# Patient Record
Sex: Female | Born: 1995 | Race: White | Hispanic: No | Marital: Single | Smoking: Never smoker
Health system: Southern US, Community
[De-identification: ages and names within clinical notes are randomized; demographics above are authoritative.]

## PROBLEM LIST (undated history)

## (undated) DIAGNOSIS — F32A Depression, unspecified: Secondary | ICD-10-CM

## (undated) DIAGNOSIS — F329 Major depressive disorder, single episode, unspecified: Secondary | ICD-10-CM

## (undated) DIAGNOSIS — Z789 Other specified health status: Secondary | ICD-10-CM

## (undated) DIAGNOSIS — Z8619 Personal history of other infectious and parasitic diseases: Secondary | ICD-10-CM

## (undated) HISTORY — PX: NO PAST SURGERIES: SHX2092

## (undated) HISTORY — DX: Other specified health status: Z78.9

## (undated) HISTORY — DX: Personal history of other infectious and parasitic diseases: Z86.19

---
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
## 2014-01-22 ENCOUNTER — Encounter (HOSPITAL_COMMUNITY): Payer: Self-pay

## 2015-10-28 ENCOUNTER — Ambulatory Visit (HOSPITAL_COMMUNITY)
Admission: EM | Admit: 2015-10-28 | Discharge: 2015-10-28 | Disposition: A | Discharge status: Home / Self Care | Payer: Medicaid Other | Attending: Emergency Medicine | Admitting: Emergency Medicine

## 2015-10-28 ENCOUNTER — Encounter (HOSPITAL_COMMUNITY): Payer: Self-pay | Admitting: Emergency Medicine

## 2015-10-28 DIAGNOSIS — J020 Streptococcal pharyngitis: Secondary | ICD-10-CM | POA: Diagnosis not present

## 2015-10-28 DIAGNOSIS — J029 Acute pharyngitis, unspecified: Secondary | ICD-10-CM

## 2015-10-28 MED ORDER — AMOXICILLIN 500 MG PO CAPS: 500.0000 mg | ORAL_CAPSULE | Freq: Three times a day (TID) | ORAL | 0 refills | Status: DC

## 2015-10-28 NOTE — ED Triage Notes (Signed)
[facility] a [facility] of a sore throat x 1 week. The patient stated that she was rapid strep tested positive 3 days ago at her daughter's pediatrician but has not been treated. The patient stated that she has started self-medicating with some amoxicillin that she already had at home.

## 2015-10-28 NOTE — ED Provider Notes (Signed)
CSN: [id number]     Arrival date & time 10/28/15  1000 History   None    Chief Complaint  Patient presents with  . Sore Throat   (Consider location/radiation/quality/duration/timing/severity/associated sxs/prior Treatment) 20 year old female complaining of sore throat for crater or equal to 1 week. Is associated with voice changes. Denies fever or chills. She states that her daughter was diagnosed with strep throat a couple days ago and while she was there her daughter's physician swapped her throat and told her she had a positive strep throat.      Past Medical History:  Diagnosis Date  . Hx of varicella   . Medical history non-contributory    Past Surgical History:  Procedure Laterality Date  . NO PAST SURGERIES     Family History  Problem Relation Age of Onset  . Hypertension Mother   . Hyperlipidemia Mother   . Hyperlipidemia Father   . Hypertension Father   . Asthma Maternal Grandmother   . COPD Maternal Grandmother   . Hypertension Maternal Grandmother   . Hyperlipidemia Maternal Grandmother   . Hyperlipidemia Maternal Grandfather   . Hypertension Maternal Grandfather   . Hyperlipidemia Paternal Grandmother   . Hypertension Paternal Grandmother   . Hyperlipidemia Paternal Grandfather   . Hypertension Paternal Grandfather   . Alcohol abuse Neg Hx   . Arthritis Neg Hx   . Cancer Neg Hx   . Birth defects Neg Hx   . Depression Neg Hx   . Diabetes Neg Hx   . Drug abuse Neg Hx   . Early death Neg Hx   . Hearing loss Neg Hx   . Heart disease Neg Hx   . Kidney disease Neg Hx   . Learning disabilities Neg Hx   . Mental illness Neg Hx   . Mental retardation Neg Hx   . Miscarriages / Stillbirths Neg Hx   . Stroke Neg Hx   . Vision loss Neg Hx   . Varicose Veins Neg Hx    Social History  Substance Use Topics  . Smoking status: Never Smoker  . Smokeless tobacco: Never Used  . Alcohol use No   OB History    Gravida Para Term Preterm AB Living   SAB TAB Ectopic Multiple Live Births           1     Review of Systems  Constitutional: Negative.  Negative for chills, fatigue and fever.       Noted in HPI otherwise Negative  HENT: Positive for sore throat and voice change. Negative for congestion, ear discharge, ear pain, sinus pressure and trouble swallowing.        Noted in HPI, otherwise negative  Eyes: Negative for pain.  Respiratory: Positive for cough. Negative for shortness of breath and wheezing.   Cardiovascular: Negative for chest pain, palpitations and leg swelling.  Gastrointestinal: Negative.   Musculoskeletal: Negative.   Skin: Negative for rash.  Neurological: Negative.   Psychiatric/Behavioral: Negative.     Allergies  Review of patient's allergies indicates no known allergies.  Home Medications   Prior to Admission medications   Medication Sig Start Date End Date Taking? Authorizing Provider  amoxicillin (AMOXIL) 500 MG capsule Take 1 capsule (500 mg total) by mouth 3 (three) times daily. 10/28/15   Hayden Rasmussen, NP  oxyCODONE-acetaminophen (PERCOCET/ROXICET) 5-325 MG per tablet Take 1 tablet by mouth every 4 (four) hours as needed (for pain scale less than  7). 12/06/13   Levi AlandMark E Anderson, MD  Prenatal Vit-Min-FA-Fish Oil ([facility] PRENATAL GUMMY PO) Take 1 tablet by mouth daily.    Historical Provider, MD   Meds Ordered and Administered this Visit  Medications - No data to display  BP 116/73 (BP Location: Left Arm)   Pulse 88   Temp 98 F (36.7 C) (Oral)   Resp 12   LMP 09/20/2015 (Exact Date)   SpO2 100%  No data found.   Physical Exam  Constitutional: She is oriented to person, place, and time. She appears well-developed and well-nourished. No distress.  HENT:  Right Ear: External ear normal.  Left Ear: External ear normal.  Mouth/Throat: No oropharyngeal exudate.  Oropharynx with much cobblestoning and clear PND. No exudates. No swelling. No tonsillar enlargement.  Eyes: EOM are normal. Pupils are  equal, round, and reactive to light.  Neck: Normal range of motion. Neck supple.  Cardiovascular: Normal rate, regular rhythm and normal heart sounds.   Pulmonary/Chest: Effort normal and breath sounds normal. No respiratory distress.  Musculoskeletal: Normal range of motion. She exhibits no edema.  Lymphadenopathy:    She has no cervical adenopathy.  Neurological: She is alert and oriented to person, place, and time.  Skin: Skin is warm and dry. No rash noted.  Psychiatric: She has a normal mood and affect.  Nursing note and vitals reviewed.   Urgent Care Course   Clinical Course    Procedures (including critical care time)  Labs Review Labs Reviewed - No data to display  Imaging Review No results found.   Visual Acuity Review  Right Eye Distance:   Left Eye Distance:   Bilateral Distance:    Right Eye Near:   Left Eye Near:    Bilateral Near:         MDM   1. Sore throat   2. Acute streptococcal pharyngitis    Meds ordered this encounter  Medications  . DISCONTD: amoxicillin (AMOXIL) 250 MG capsule    Sig: Take 250 mg by mouth 2 (two) times daily.  Marland Kitchen. amoxicillin (AMOXIL) 500 MG capsule    Sig: Take 1 capsule (500 mg total) by mouth 3 (three) times daily.    Dispense:  21 capsule    Refill:  0    Order Specific Question:   Supervising Provider    Answer:   Charm RingsHONIG, ERIN J [[id number]]   Lots of fluids Motrin prn Allegra or Zyrtec for PND    Hayden Rasmussenavid Dari Carpenito, NP 10/28/15 1057

## 2018-03-23 NOTE — L&D Delivery Note (Signed)
Delivery Note Patient pushed well with two contractions.  At 5:59 PM a viable female was delivered via Vaginal, Spontaneous (Presentation:   Occiput Anterior).  APGAR: 8, 9; weight 3314 g ( 7 lb 4.9 oz).  Placenta status: Spontaneous, Intact.  Cord: 3 vessels with the following complications: None.  Cord pH: n/a  Anesthesia: Epidural Episiotomy: None Lacerations:  2nd degree Suture Repair: 3.0 vicryl rapide Est. Blood Loss (mL):  108  Mom to postpartum.  Baby to Couplet care / Skin to Skin.  [place] 03/04/2019, 6:18 PM

## 2018-03-31 ENCOUNTER — Emergency Department (HOSPITAL_BASED_OUTPATIENT_CLINIC_OR_DEPARTMENT_OTHER): Payer: BLUE CROSS/BLUE SHIELD

## 2018-03-31 ENCOUNTER — Emergency Department (HOSPITAL_BASED_OUTPATIENT_CLINIC_OR_DEPARTMENT_OTHER)
Admission: EM | Admit: 2018-03-31 | Discharge: 2018-03-31 | Disposition: A | Discharge status: Home / Self Care | Payer: BLUE CROSS/BLUE SHIELD | Attending: Emergency Medicine | Admitting: Emergency Medicine

## 2018-03-31 ENCOUNTER — Other Ambulatory Visit: Payer: Self-pay

## 2018-03-31 ENCOUNTER — Encounter (HOSPITAL_BASED_OUTPATIENT_CLINIC_OR_DEPARTMENT_OTHER): Payer: Self-pay

## 2018-03-31 DIAGNOSIS — M546 Pain in thoracic spine: Secondary | ICD-10-CM

## 2018-03-31 DIAGNOSIS — Y939 Activity, unspecified: Secondary | ICD-10-CM | POA: Insufficient documentation

## 2018-03-31 DIAGNOSIS — M545 Low back pain, unspecified: Secondary | ICD-10-CM

## 2018-03-31 DIAGNOSIS — Z79899 Other long term (current) drug therapy: Secondary | ICD-10-CM | POA: Insufficient documentation

## 2018-03-31 DIAGNOSIS — Y999 Unspecified external cause status: Secondary | ICD-10-CM | POA: Diagnosis not present

## 2018-03-31 MED ORDER — METHOCARBAMOL 500 MG PO TABS: 500.0000 mg | ORAL_TABLET | Freq: Two times a day (BID) | ORAL | 0 refills | Status: DC

## 2018-03-31 MED ORDER — NAPROXEN 500 MG PO TABS: 500.0000 mg | ORAL_TABLET | Freq: Two times a day (BID) | ORAL | 0 refills | Status: DC

## 2018-03-31 NOTE — ED Notes (Signed)
Pt requesting to waive the pregnancy test.
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
## 2018-03-31 NOTE — ED Notes (Signed)
Patient transported to X-ray 
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
## 2018-07-26 LAB — OB RESULTS CONSOLE GC/CHLAMYDIA
Chlamydia: NEGATIVE
Gonorrhea: NEGATIVE

## 2018-09-01 LAB — OB RESULTS CONSOLE HIV ANTIBODY (ROUTINE TESTING): HIV: NONREACTIVE

## 2018-09-01 LAB — OB RESULTS CONSOLE HEPATITIS B SURFACE ANTIGEN: Hepatitis B Surface Ag: NEGATIVE

## 2018-09-01 LAB — OB RESULTS CONSOLE RUBELLA ANTIBODY, IGM: Rubella: IMMUNE

## 2018-09-01 LAB — OB RESULTS CONSOLE RPR: RPR: NONREACTIVE

## 2019-01-23 ENCOUNTER — Encounter (HOSPITAL_COMMUNITY): Payer: Self-pay | Admitting: *Deleted

## 2019-01-23 ENCOUNTER — Other Ambulatory Visit: Payer: Self-pay

## 2019-01-23 ENCOUNTER — Inpatient Hospital Stay (HOSPITAL_COMMUNITY)
Admission: AD | Admit: 2019-01-23 | Discharge: 2019-01-27 | DRG: 833 | Disposition: A | Discharge status: Home / Self Care | Payer: Managed Care, Other (non HMO) | Attending: Obstetrics and Gynecology | Admitting: Obstetrics and Gynecology

## 2019-01-23 DIAGNOSIS — O4703 False labor before 37 completed weeks of gestation, third trimester: Secondary | ICD-10-CM | POA: Diagnosis present

## 2019-01-23 DIAGNOSIS — O479 False labor, unspecified: Secondary | ICD-10-CM

## 2019-01-23 DIAGNOSIS — Z20828 Contact with and (suspected) exposure to other viral communicable diseases: Secondary | ICD-10-CM | POA: Diagnosis present

## 2019-01-23 DIAGNOSIS — Z3A33 33 weeks gestation of pregnancy: Secondary | ICD-10-CM | POA: Diagnosis not present

## 2019-01-23 DIAGNOSIS — O4700 False labor before 37 completed weeks of gestation, unspecified trimester: Secondary | ICD-10-CM

## 2019-01-23 LAB — URINALYSIS, ROUTINE W REFLEX MICROSCOPIC
Bilirubin Urine: NEGATIVE
Glucose, UA: NEGATIVE mg/dL
Hgb urine dipstick: NEGATIVE
Ketones, ur: NEGATIVE mg/dL
Nitrite: NEGATIVE
Protein, ur: 30 mg/dL — AB
Specific Gravity, Urine: 1.026 (ref 1.005–1.030)
pH: 5 (ref 5.0–8.0)

## 2019-01-23 LAB — OB RESULTS CONSOLE GBS: GBS: NEGATIVE

## 2019-01-23 MED ORDER — NIFEDIPINE 10 MG PO CAPS
10.0000 mg | ORAL_CAPSULE | ORAL | Status: AC | PRN
  Administered 2019-01-23 – 2019-01-25 (×4): 10 mg via ORAL
  Filled 2019-01-23 (×4): qty 1

## 2019-01-23 MED ORDER — MAGNESIUM SULFATE 40 GM/1000ML IV SOLN
2.0000 g/h | INTRAVENOUS | Status: DC
  Administered 2019-01-24 (×2): 2 g/h via INTRAVENOUS
  Filled 2019-01-23: qty 1000

## 2019-01-23 MED ORDER — MAGNESIUM SULFATE BOLUS VIA INFUSION
4.0000 g | Freq: Once | INTRAVENOUS | Status: AC
  Administered 2019-01-24: 4 g via INTRAVENOUS
  Filled 2019-01-23: qty 1000

## 2019-01-23 MED ORDER — ACETAMINOPHEN 325 MG PO TABS
650.0000 mg | ORAL_TABLET | ORAL | Status: DC | PRN
  Administered 2019-01-24 – 2019-01-26 (×4): 650 mg via ORAL
  Filled 2019-01-23 (×4): qty 2

## 2019-01-23 MED ORDER — DOCUSATE SODIUM 100 MG PO CAPS
100.0000 mg | ORAL_CAPSULE | Freq: Every day | ORAL | Status: DC
  Administered 2019-01-25 – 2019-01-26 (×2): 100 mg via ORAL
  Filled 2019-01-23 (×3): qty 1

## 2019-01-23 MED ORDER — CALCIUM CARBONATE ANTACID 500 MG PO CHEW: 2.0000 | CHEWABLE_TABLET | ORAL | Status: DC | PRN

## 2019-01-23 MED ORDER — LACTATED RINGERS IV SOLN
INTRAVENOUS | Status: DC
  Administered 2019-01-23 – 2019-01-24 (×3): via INTRAVENOUS

## 2019-01-23 MED ORDER — BETAMETHASONE SOD PHOS & ACET 6 (3-3) MG/ML IJ SUSP
12.0000 mg | Freq: Once | INTRAMUSCULAR | Status: AC
  Administered 2019-01-23: 12 mg via INTRAMUSCULAR
  Filled 2019-01-23: qty 5

## 2019-01-23 MED ORDER — MAGNESIUM SULFATE 40 GM/1000ML IV SOLN
INTRAVENOUS | Status: AC
  Administered 2019-01-24: 4 g via INTRAVENOUS
  Filled 2019-01-23: qty 1000

## 2019-01-23 MED ORDER — BETAMETHASONE SOD PHOS & ACET 6 (3-3) MG/ML IJ SUSP
12.0000 mg | Freq: Once | INTRAMUSCULAR | Status: AC
  Administered 2019-01-24: 12 mg via INTRAMUSCULAR
  Filled 2019-01-23: qty 5

## 2019-01-23 NOTE — MAU Note (Signed)
Been having contractions every 15-20 min for the last 2 days, has been having a lot of pressure. No bleeding or leaking.  Went to dr on [address] [employer], was 1+cm. No problems with preg.

## 2019-01-23 NOTE — MAU Provider Note (Addendum)
History     CSN: [id number]  Arrival date and time: 01/23/19 8466   First Provider Initiated Contact with Patient 01/23/19 2024      Chief Complaint  Patient presents with  . Contractions   Kim Weiss is a 23 y.o. G2P1 at [redacted]w[redacted]d who presents to MAU with complaints of contractions. She reports contractions started occurring yesterday around 0530/0600 when she woke up with contractions that radiated around to her back. She reports contractions are occurring every 15-20 - reports having to breath through the contractions and husband states she "was bent over bed earlier in pain and could not move". She reports increased pelvic pressure. Last prenatal appointment was on Thursday and she was 1.5cm at that appointment. She denies complications during this pregnancy and denies hx of PTL/PTD with last pregnancy. She denies vaginal bleeding, discharge or LOF. She denies recent IC. Reports +FM.   OB History    Gravida  2   Para  1   Term  1   Preterm      AB      Living  1     SAB      TAB      Ectopic      Multiple      Live Births  1           Past Medical History:  Diagnosis Date  . Hx of varicella   . Medical history non-contributory     Past Surgical History:  Procedure Laterality Date  . NO PAST SURGERIES      Family History  Problem Relation Age of Onset  . Hypertension Mother   . Hyperlipidemia Mother   . Hyperlipidemia Father   . Hypertension Father   . Asthma Maternal Grandmother   . COPD Maternal Grandmother   . Hypertension Maternal Grandmother   . Hyperlipidemia Maternal Grandmother   . Hyperlipidemia Maternal Grandfather   . Hypertension Maternal Grandfather   . Hyperlipidemia Paternal Grandmother   . Hypertension Paternal Grandmother   . Hyperlipidemia Paternal Grandfather   . Hypertension Paternal Grandfather   . Alcohol abuse Neg Hx   . Arthritis Neg Hx   . Cancer Neg Hx   . Birth defects Neg Hx   . Depression Neg Hx   . Diabetes  Neg Hx   . Drug abuse Neg Hx   . Early death Neg Hx   . Hearing loss Neg Hx   . Heart disease Neg Hx   . Kidney disease Neg Hx   . Learning disabilities Neg Hx   . Mental illness Neg Hx   . Mental retardation Neg Hx   . Miscarriages / Stillbirths Neg Hx   . Stroke Neg Hx   . Vision loss Neg Hx   . Varicose Veins Neg Hx     Social History   Tobacco Use  . Smoking status: Never Smoker  . Smokeless tobacco: Never Used  Substance Use Topics  . Alcohol use: No  . Drug use: Never    Allergies: No Known Allergies  Medications Prior to Admission  Medication Sig Dispense Refill Last Dose  . acetaminophen (TYLENOL) 500 MG tablet Take 1,000 mg by mouth every 6 (six) hours as needed.   01/22/2019 at Unknown time  . amoxicillin (AMOXIL) 500 MG capsule Take 1 capsule (500 mg total) by mouth 3 (three) times daily. 21 capsule 0   . methocarbamol (ROBAXIN) 500 MG tablet Take 1 tablet (500 mg total) by mouth 2 (two) times  daily. 20 tablet 0   . naproxen (NAPROSYN) 500 MG tablet Take 1 tablet (500 mg total) by mouth 2 (two) times daily. 30 tablet 0   . oxyCODONE-acetaminophen (PERCOCET/ROXICET) 5-325 MG per tablet Take 1 tablet by mouth every 4 (four) hours as needed (for pain scale less than 7). 30 tablet 0   . Prenatal Vit-Min-FA-Fish Oil ([employer] PRENATAL GUMMY PO) Take 1 tablet by mouth daily.   More than a month at Unknown time    Review of Systems  Constitutional: Negative.   Respiratory: Negative.   Cardiovascular: Negative.   Gastrointestinal: Positive for abdominal pain. Negative for constipation, diarrhea, nausea and vomiting.       Contractions  Genitourinary: Negative.   Musculoskeletal: Positive for back pain.  Neurological: Negative.   Psychiatric/Behavioral: Negative.    Physical Exam   Blood pressure 121/73, pulse (!) 113, temperature 98.3 F (36.8 C), temperature source Oral, resp. rate 17, height 5\' 2"  (1.575 m), weight 75.7 kg, SpO2 100 %, unknown if currently  breastfeeding.  Physical Exam  Nursing note and vitals reviewed. Constitutional: She is oriented to person, place, and time. She appears well-developed and well-nourished. No distress.  Cardiovascular: Normal rate and regular rhythm.  Respiratory: Effort normal and breath sounds normal. No respiratory distress. She has no wheezes.  GI: Soft. There is no rebound and no guarding.  Gravid appropriate, mild-mod contractions palpated   Musculoskeletal: Normal range of motion.        General: No edema.  Neurological: She is alert and oriented to person, place, and time.  Psychiatric: She has a normal mood and affect. Her behavior is normal. Thought content normal.   FFN collected prior to cervical examination  Initial cervical examination  Dilation: 3 Effacement (%): 70 Cervical Position: Middle Station: -1 Presentation: Vertex Exam by:: [email or web address] CNM   FHR: 140/moderate/+accels/ no decel  Toco: UI with contractions every 4-5 minutes/ mild- mod by palpation   MAU Course  Procedures  MDM - Hydration  - FFN collected but not sent at this time  - Cervical examination - noted 3cm/70/-1 - Procardia ordered   Reassessment at 2145: procardia x3 completed, patient is orally drinking fluid  - cervix rechecked, slight cervical change  Dilation: 3.5 Effacement (%): 70 Cervical Position: Middle Station: -1 Presentation: Vertex Exam by:: [email or web address] CNM - 1st dose of BMZ ordered and given  - encouraged patient to continue oral hydration - second pitcher given  - plan to recheck cervix in 1 hour   Reassessment at 2250:  - cervix rechecked, continued cervical change  Dilation: 4 Effacement (%): 70 Cervical Position: Middle Station: -1 Presentation: Vertex Exam by:: [email or web address] CNM - Patient reports pain is still the same -8/10 when she has contractions, reports feeling contractions every 8-10 minutes   Consult with Dr Steward Drone who recommends admission for observation  with tocolytic of private's choice, suggested magnesium.   Discussed patient with Dr Macon Large who agrees with plan of care and admission. Dr Chestine Spore plans to place orders for admission to antepartum. Repeat BMZ tomorrow. Mag to be initiated.   Discussed plan of care with patient - answered patient's questions. Pt stable at time of transfer to antepartum.   Assessment and Plan  1. Preterm labor, threatened in third trimester   Admit to [facility] speciality care  Magnesium  BMZ - second dose tomorrow  Orders placed by Dr [facility]  Care taken over by Dr Chestine Spore   Chestine Spore CNM 01/23/2019, 11:31 PM

## 2019-01-24 ENCOUNTER — Encounter (HOSPITAL_COMMUNITY): Payer: Self-pay | Admitting: Obstetrics

## 2019-01-24 LAB — CBC
HCT: 32 % — ABNORMAL LOW (ref 36.0–46.0)
Hemoglobin: 10.4 g/dL — ABNORMAL LOW (ref 12.0–15.0)
MCH: 27.1 pg (ref 26.0–34.0)
MCHC: 32.5 g/dL (ref 30.0–36.0)
MCV: 83.3 fL (ref 80.0–100.0)
Platelets: 274 10*3/uL (ref 150–400)
RBC: 3.84 MIL/uL — ABNORMAL LOW (ref 3.87–5.11)
RDW: 12.9 % (ref 11.5–15.5)
WBC: 16.1 10*3/uL — ABNORMAL HIGH (ref 4.0–10.5)
nRBC: 0 % (ref 0.0–0.2)

## 2019-01-24 LAB — SARS CORONAVIRUS 2 (TAT 6-24 HRS): SARS Coronavirus 2: NEGATIVE

## 2019-01-24 LAB — AMNISURE RUPTURE OF MEMBRANE (ROM) NOT AT ARMC: Amnisure ROM: NEGATIVE

## 2019-01-24 MED ORDER — BUTORPHANOL TARTRATE 1 MG/ML IJ SOLN: 1.0000 mg | INTRAMUSCULAR | Status: DC | PRN

## 2019-01-24 MED ORDER — LACTATED RINGERS IV BOLUS
500.0000 mL | Freq: Once | INTRAVENOUS | Status: AC
  Administered 2019-01-24: 500 mL via INTRAVENOUS

## 2019-01-24 NOTE — Progress Notes (Signed)
Called Dr.Clark pt c/o abdominal ctx's 8/10 pain scale, HR 116-120's, and pt requesting pain meds at this time.  As per Dr. Carlis Abbott "Give Tylenol as ordered".  No new orders at this time.

## 2019-01-24 NOTE — Progress Notes (Signed)
Contractions have spaced with MgSO4.  She reports that they are still painful with pressure when they do occur.  She requests repeat cervical exam  SVE: 4/70/3/posterior/soft EFM: category 1 Toco: <q10 min  G3P1 @ [redacted]w[redacted]d w PTL --Continue MgSO4 through 2nd dose of BMZ tonight --NICU consult --GBS pending

## 2019-01-24 NOTE — H&P (Signed)
23 y.o. G2P1001 @ [redacted]w[redacted]d presents with contractions.  She called the office today with complaints of abdominal cramping and diarrhea yesterday.  Since that time, she has noticed back pain and cramping / contractions every 15-20 minutes.  She reports staying hydrated.  At her last routine OB visit, she reported an increase in pelvic pressure.  At that time, cervix was closed (but 1 cm externally).  In MAU, she was noted to be contracting on the monitor q4-5 minutes, but the patient was only able to appreciate contractions q8-10 minutes.  Cervix was 3/70 and changed to 4/70 despite hydration and procardia.    Past Medical History:  Diagnosis Date  . Hx of varicella   . Medical history non-contributory     Past Surgical History:  Procedure Laterality Date  . NO PAST SURGERIES      OB History  Gravida Para Term Preterm AB Living  2 1 1     1   SAB TAB Ectopic Multiple Live Births          1    # Outcome Date GA Lbr Len/2nd Weight Sex Delivery Anes PTL Lv  2 Current           1 Term 12/04/13 [redacted]w[redacted]d 06:06 / 00:24 3022 g F Vag-Spont EPI  LIV    Social History   Socioeconomic History  . Marital status: Single    Spouse name: Not on file  . Number of children: Not on file  . Years of education: Not on file  . Highest education level: Not on file  Occupational History  . Not on file  Tobacco Use  . Smoking status: Never Smoker  . Smokeless tobacco: Never Used  Substance and Sexual Activity  . Alcohol use: No  . Drug use: Never  . Sexual activity: Never   Patient has no known allergies.    Prenatal Transfer Tool  Maternal Diabetes: No Genetic Screening: Normal Maternal Ultrasounds/Referrals: Normal Fetal Ultrasounds or other Referrals:  None Maternal Substance Abuse:  No Significant Maternal Medications:  None Significant Maternal Lab Results: None  ABO, Rh: --/--/O NEG (11/02 2312) Antibody: POS (11/02 2312) Rubella:  Immune RPR:   NR HBsAg:   Neg HIV:   Neg GBS:    Unknown    Vitals:   01/23/19 2119 01/23/19 2121  BP: 118/75 121/69  Pulse: (!) 126 (!) 136  Resp:    Temp:    SpO2:       General:  NAD Abdomen:  soft, gravid SVE:  4/70/-1 per CNM FHTs:  120s, moderate variability, category 1 Toco:  q8-10 minutes   A/P   23 y.o. G2P1001 [redacted]w[redacted]d presents with PTL Admit to AP for observation Prematurity:  BMZ series.  Given regular contractions despite IVF/procardia, will start MgSO4 for tocolysis through second dose of BMZ Collect GBS   [place]

## 2019-01-24 NOTE — Progress Notes (Signed)
Pt stated she felt some fluid from vagina.  Amnisure is negative. SSE neg for pooling or valsalva, no fluid seen at all.  Visually 4 cm or less.  FHTs 120s, gSTV, NST R, cat 1 Toco q 20-40 minutes  Reassured pt that she is stable and no signs of ROM.  No appearance of labor.  On Magnesium sulfate until MN tonight- will d/c after second dose of BMZ.

## 2019-01-25 ENCOUNTER — Inpatient Hospital Stay (HOSPITAL_COMMUNITY): Payer: Managed Care, Other (non HMO)

## 2019-01-25 DIAGNOSIS — O479 False labor, unspecified: Secondary | ICD-10-CM

## 2019-01-25 DIAGNOSIS — Z3A33 33 weeks gestation of pregnancy: Secondary | ICD-10-CM

## 2019-01-25 LAB — GC/CHLAMYDIA PROBE AMP (~~LOC~~) NOT AT ARMC
Chlamydia: NEGATIVE
Comment: NEGATIVE
Comment: NORMAL
Neisseria Gonorrhea: NEGATIVE

## 2019-01-25 MED ORDER — LACTATED RINGERS IV BOLUS
500.0000 mL | Freq: Once | INTRAVENOUS | Status: AC
  Administered 2019-01-25: 14:00:00 500 mL via INTRAVENOUS

## 2019-01-25 NOTE — Progress Notes (Signed)
Pt continues to feel ctx but has not changed in intensity or frequency since initial change.  No other labor symptoms.  Ctx are mild when palpated with little change from previous assessment.  Pt has remained on monitor per her request.  Pt give option to DC monitoring.  Pt agrees.  MD notified of pt status.  Plan established to continue to watch for any change in ctx pattern and notify MD if changes occur.  Pt educated on what to expect if active labor begins and told to notify nurse if there is any change.  Pt agreeable and expresses understanding.

## 2019-01-25 NOTE — Progress Notes (Signed)
Kim Weiss 23 y.o. [id number] at [redacted]w[redacted]d HD#2 admitted with PTL S: Reports contractions stronger but continue q10-50m, reports blood tinged discharge since admit otherwise denies LOF. Normal FM O: Vitals:   01/25/19 0008 01/25/19 0424 01/25/19 0425 01/25/19 0826  BP: (!) 104/53 105/61  113/60  Pulse: 88 98  98  Resp: 16 18  18   Temp: 98 F (36.7 C) 98.2 F (36.8 C)  98.3 F (36.8 C)  TempSrc: Oral Oral  Oral  SpO2:  100% 100% 98%  Weight:      Height:       SVE 4/70/-2/posterior/soft  NST FHR 120, +accels, no decels. Toco quiet  A/p: Kim Weiss 23 y.o. G3P1011 at [redacted]w[redacted]d Hd#2 for PTL, now stable, cont to monitor symptoms today 1. PTL: SVE 3cm to 4/70/-1, S/p Magnesium through BMZ. Neg amnisure & r/o ROM 11/3 PM. Remains stable. Monitor symptoms. GC/CT pending. UA neg 2. PM: BMZ 11/2-3 2130, ordered growth [place] and NICU consult 3. GBS pending  [name] 01/25/19 10:43 AM

## 2019-01-25 NOTE — Progress Notes (Signed)
Pt called out c/o more frequent ctx pattern.  FOB is at bedside and pt just returned from walking the halls and [place] visit.  Pt put on monitor and given a mark button to mark when she is contracting.  Monitor appears to show ctx pattern consistent with what she is feeling.    Md Taam notified.  Order given for IV bolus and to continue watching.

## 2019-01-25 NOTE — Consult Note (Signed)
Neonatology Note:  Asked by Dr Shellia Cleverly to consult on Kim Weiss to discuss expectations for possible preterm delivery. She is now 33 6/7 wks, pregnancy is complicated by PTL. GBS is pending. She has received 2 doses of betamethasone. She had an [place] by MFM today, result pending.  I spoke to Kim Weiss in her room with her partner present. I discussed the most common clinical course of a 34 wk preterm. Infant may or may not need resp support but most of the stay is to optimize nutrition while gaining weight and maturation. We discussed breastfeeding and its benefits. I discussed LOS.  She appeared to be having interm contractions during our conversation. I notified her RN per the couple's request.  Thank you for this consult.  This consult took 30 min, more than half of the time was face to face with the patient and coordination of care.  Tommie Sams MD Neonatologist

## 2019-01-26 LAB — CULTURE, BETA STREP (GROUP B ONLY)

## 2019-01-26 MED ORDER — GUAIFENESIN 100 MG/5ML PO SOLN
15.0000 mL | ORAL | Status: DC | PRN
  Administered 2019-01-26: 21:00:00 300 mg via ORAL
  Filled 2019-01-26: qty 15

## 2019-01-26 NOTE — Progress Notes (Signed)
Fetal monitors placed due to pt c/o vaginal pressure and contractions. On NST, RN noted irregular u/c's, 3 u/c's noted in 30 minutes that are mild in palpation. Pt states this correlates with how often she is feeling u/c's. Monitors d/c'd. Pt educated about u/c frequency and labor signs/symptoms. Instructed pt to notify RN if u/c's become more frequent and/or intense, vaginal bleeding, or leaking of fluid occurs.

## 2019-01-26 NOTE — Progress Notes (Signed)
Pt states that she has noticed increased pressure and increased ctxs. On NST there are no increase in ctxs. Nursing service checked her cx this am and stated no change in cx.  Plan/ Will continue to obs. If stable and symptoms improve may discharge to home in am

## 2019-01-27 LAB — TYPE AND SCREEN
ABO/RH(D): O NEG
Antibody Screen: POSITIVE
Unit division: 0
Unit division: 0

## 2019-01-27 LAB — BPAM RBC
Blood Product Expiration Date: 202012112359
Blood Product Expiration Date: 202012112359
Unit Type and Rh: 9500
Unit Type and Rh: 9500

## 2019-01-27 NOTE — Discharge Summary (Signed)
Physician Discharge Summary  Patient ID: Kim Weiss MRN: [id number] DOB/AGE: [birth year] 23 y.o.  Admit date: 01/23/2019 Discharge date: 01/27/2019  Admission Diagnoses: Preterm labor  Discharge Diagnoses:  Active Problems:   Preterm uterine contractions in third trimester, antepartum   Preterm labor in third trimester without delivery   Discharged Condition: good  Hospital Course: Pt stable after getting magnesium sulfate and betamethasone.  GBS neg.  Rare contractions on discharge.   Consults: None  Significant Diagnostic Studies: labs: see chart   Treatments: IV hydration and magnesium sulfate and betamethasone  Discharge Exam: Blood pressure 104/64, pulse 79, temperature 97.9 F (36.6 C), temperature source Oral, resp. rate 16, height 5\' 2"  (1.575 m), weight 75.7 kg, SpO2 100 %, unknown if currently breastfeeding.   Disposition: Discharge disposition: 01-Home or Self Care       Discharge Instructions    Discharge activity:  Up to eat   Complete by: As directed    Discharge diet:  No restrictions   Complete by: As directed    Discharge instructions   Complete by: As directed    Modified bedrest.  Refrain from intercourse.  Count baby's movements in 1 hour per day- if you don't get 6 in that hour, call.   Do not have sex or do anything that might make you have an orgasm   Complete by: As directed    Fetal Kick Count:  Lie on our left side for one hour after a meal, and count the number of times your baby kicks.  If it is less than 5 times, get up, move around and drink some juice.  Repeat the test 30 minutes later.  If it is still less than 5 kicks in an hour, notify your doctor.   Complete by: As directed    LABOR:  When conractions begin, you should start to time them from the beginning of one contraction to the beginning  of the next.  When contractions are 5 - 10 minutes apart or less and have been regular for at least an hour, you should call your health care  provider.   Complete by: As directed    Notify physician for bleeding from the vagina   Complete by: As directed    Notify physician for blurring of vision or spots before the eyes   Complete by: As directed    Notify physician for chills or fever   Complete by: As directed    Notify physician for fainting spells, "black outs" or loss of consciousness   Complete by: As directed    Notify physician for increase in vaginal discharge   Complete by: As directed    Notify physician for leaking of fluid   Complete by: As directed    Notify physician for pain or burning when urinating   Complete by: As directed    Notify physician for pelvic pressure (sudden increase)   Complete by: As directed    Notify physician for severe or continued nausea or vomiting   Complete by: As directed    Notify physician for sudden gushing of fluid from the vagina (with or without continued leaking)   Complete by: As directed    Notify physician for sudden, constant, or occasional abdominal pain   Complete by: As directed    Notify physician if baby moving less than usual   Complete by: As directed      Allergies as of 01/27/2019   No Known Allergies     Medication List  STOP taking these medications   amoxicillin 500 MG capsule Commonly known as: AMOXIL   methocarbamol 500 MG tablet Commonly known as: ROBAXIN   naproxen 500 MG tablet Commonly known as: NAPROSYN     TAKE these medications   acetaminophen 500 MG tablet Commonly known as: TYLENOL Take 1,000 mg by mouth every 6 (six) hours as needed.   [employer] PRENATAL GUMMY PO Take 1 tablet by mouth daily.   oxyCODONE-acetaminophen 5-325 MG tablet Commonly known as: PERCOCET/ROXICET Take 1 tablet by mouth every 4 (four) hours as needed (for pain scale less than 7).      Follow-up Information    Ob/Gyn, Nestor Ramp Follow up in 1 week(s).   Contact information: [address] [place] [address] [place] [address] [phone]            Signed: Loney Laurence 01/27/2019, 10:00 AM

## 2019-01-27 NOTE — Progress Notes (Addendum)
23 y.o. [id number] [redacted]w[redacted]d HD#4 admitted for ctx .  Pt currently stable with no c/o except for some contractions overnight.  Good FM.  Vitals:   01/26/19 1620 01/26/19 1934 01/27/19 0304 01/27/19 0756  BP: 108/65 110/65 111/69 104/64  Pulse: (!) 103 96 90 79  Resp: 18 18 17 16   Temp: 98.2 F (36.8 C) 98.4 F (36.9 C) 97.9 F (36.6 C) 97.9 F (36.6 C)  TempSrc: Oral Oral Oral Oral  SpO2: 100% 100% 99% 100%  Weight:      Height:        Lungs CTA Cor RRR Abd  Soft, gravid, nontender Ex SCDs FHTs  120s, good short term variability, NST R Toco  2/hour  No results found for this or any previous visit (from the past 24 hour(s)).  A:  HD#4  [redacted]w[redacted]d with preterm labor s/p BMZ.Marland Kitchen  P: GBS is negative. Pt does not appear to be in labor now.  Pt desires to go home today.  Precautions given.   Daria Pastures

## 2019-01-27 NOTE — Progress Notes (Signed)
Pt discharged home with mother. Discharge teaching, home care, s/s of PTL, reasons to seek care, and follow-up discussed. Pt verbalized understanding. Fetal monitor strip second reviewed by Colin Mulders RN.

## 2019-01-27 NOTE — Progress Notes (Signed)
At 0250 Pt stated feeling increased ctxs since 0230 and pressure. Applied [place] and Toco. Saw x2 UC and some UI during 1.5hrs, however, pt stated still feeling intermittent ctxs. Palpated and was unable to feel any change. RN checked cervix. No change.

## 2019-02-16 ENCOUNTER — Encounter (HOSPITAL_COMMUNITY): Payer: Self-pay

## 2019-02-16 ENCOUNTER — Inpatient Hospital Stay (HOSPITAL_COMMUNITY)
Admission: AD | Admit: 2019-02-16 | Discharge: 2019-02-16 | Disposition: A | Discharge status: Home / Self Care | Payer: Managed Care, Other (non HMO) | Attending: Obstetrics and Gynecology | Admitting: Obstetrics and Gynecology

## 2019-02-16 ENCOUNTER — Other Ambulatory Visit: Payer: Self-pay

## 2019-02-16 DIAGNOSIS — O471 False labor at or after 37 completed weeks of gestation: Secondary | ICD-10-CM | POA: Insufficient documentation

## 2019-02-16 DIAGNOSIS — O479 False labor, unspecified: Secondary | ICD-10-CM | POA: Diagnosis present

## 2019-02-16 DIAGNOSIS — Z3A37 37 weeks gestation of pregnancy: Secondary | ICD-10-CM

## 2019-02-16 NOTE — MAU Note (Addendum)
Presents to MAU complaining of CTX since 5am this morning  "closer than 10 minutes." No vaginal bleeding or leaking of fluids. +FM. Patient states that last cervical check 02/15/19 was 4cm.

## 2019-02-16 NOTE — MAU Provider Note (Signed)
Ms. ALIX LAHMANN is a [id number] at [redacted]w[redacted]d seen in MAU for labor. RN labor check, not seen by provider. SVE by RN Dilation: 4 Effacement (%): 70 Station: -1 Presentation: Vertex Exam by:: Alondra Sedano  Cervix unchanged after 1 hour of observation  NST - FHR: 135 bpm / moderate variability / accels present / decels absent / TOCO: UI with rare UC's noted   Plan:  D/C home with labor precautions Keep scheduled appt with [facility] on 02/22/2019  Laury Deep, [place]  02/16/2019 3:10 PM

## 2019-03-04 ENCOUNTER — Other Ambulatory Visit: Payer: Self-pay

## 2019-03-04 ENCOUNTER — Inpatient Hospital Stay (HOSPITAL_COMMUNITY)
Admission: AD | Admit: 2019-03-04 | Discharge: 2019-03-06 | DRG: 807 | Disposition: A | Discharge status: Home / Self Care | Payer: Managed Care, Other (non HMO) | Attending: Obstetrics | Admitting: Obstetrics

## 2019-03-04 ENCOUNTER — Inpatient Hospital Stay (HOSPITAL_COMMUNITY): Payer: Managed Care, Other (non HMO) | Admitting: Anesthesiology

## 2019-03-04 ENCOUNTER — Encounter (HOSPITAL_COMMUNITY): Payer: Self-pay | Admitting: Obstetrics

## 2019-03-04 DIAGNOSIS — O99214 Obesity complicating childbirth: Secondary | ICD-10-CM | POA: Diagnosis present

## 2019-03-04 DIAGNOSIS — E669 Obesity, unspecified: Secondary | ICD-10-CM | POA: Diagnosis present

## 2019-03-04 DIAGNOSIS — Z3A39 39 weeks gestation of pregnancy: Secondary | ICD-10-CM

## 2019-03-04 DIAGNOSIS — Z20828 Contact with and (suspected) exposure to other viral communicable diseases: Secondary | ICD-10-CM | POA: Diagnosis present

## 2019-03-04 DIAGNOSIS — O368130 Decreased fetal movements, third trimester, not applicable or unspecified: Secondary | ICD-10-CM | POA: Diagnosis present

## 2019-03-04 DIAGNOSIS — Z6791 Unspecified blood type, Rh negative: Secondary | ICD-10-CM

## 2019-03-04 DIAGNOSIS — O479 False labor, unspecified: Secondary | ICD-10-CM

## 2019-03-04 DIAGNOSIS — O26893 Other specified pregnancy related conditions, third trimester: Secondary | ICD-10-CM | POA: Diagnosis present

## 2019-03-04 HISTORY — DX: Depression, unspecified: F32.A

## 2019-03-04 LAB — RESPIRATORY PANEL BY RT PCR (FLU A&B, COVID)
Influenza A by PCR: NEGATIVE
Influenza B by PCR: NEGATIVE
SARS Coronavirus 2 by RT PCR: NEGATIVE

## 2019-03-04 LAB — CBC
HCT: 32.2 % — ABNORMAL LOW (ref 36.0–46.0)
Hemoglobin: 10 g/dL — ABNORMAL LOW (ref 12.0–15.0)
MCH: 24.2 pg — ABNORMAL LOW (ref 26.0–34.0)
MCHC: 31.1 g/dL (ref 30.0–36.0)
MCV: 78 fL — ABNORMAL LOW (ref 80.0–100.0)
Platelets: 275 10*3/uL (ref 150–400)
RBC: 4.13 MIL/uL (ref 3.87–5.11)
RDW: 13.7 % (ref 11.5–15.5)
WBC: 10.1 10*3/uL (ref 4.0–10.5)
nRBC: 0 % (ref 0.0–0.2)

## 2019-03-04 MED ORDER — COCONUT OIL OIL: 1.0000 "application " | TOPICAL_OIL | Status: DC | PRN

## 2019-03-04 MED ORDER — LACTATED RINGERS IV SOLN: 500.0000 mL | Freq: Once | INTRAVENOUS | Status: DC

## 2019-03-04 MED ORDER — WITCH HAZEL-GLYCERIN EX PADS: 1.0000 "application " | MEDICATED_PAD | CUTANEOUS | Status: DC | PRN

## 2019-03-04 MED ORDER — PHENYLEPHRINE 40 MCG/ML (10ML) SYRINGE FOR IV PUSH (FOR BLOOD PRESSURE SUPPORT): 80.0000 ug | PREFILLED_SYRINGE | INTRAVENOUS | Status: DC | PRN

## 2019-03-04 MED ORDER — SOD CITRATE-CITRIC ACID 500-334 MG/5ML PO SOLN: 30.0000 mL | ORAL | Status: DC | PRN

## 2019-03-04 MED ORDER — LIDOCAINE HCL (PF) 1 % IJ SOLN: 30.0000 mL | INTRAMUSCULAR | Status: DC | PRN

## 2019-03-04 MED ORDER — EPHEDRINE 5 MG/ML INJ: 10.0000 mg | INTRAVENOUS | Status: DC | PRN

## 2019-03-04 MED ORDER — OXYTOCIN 40 UNITS IN NORMAL SALINE INFUSION - SIMPLE MED
1.0000 m[IU]/min | INTRAVENOUS | Status: DC
  Administered 2019-03-04: 2 m[IU]/min via INTRAVENOUS
  Filled 2019-03-04: qty 1000

## 2019-03-04 MED ORDER — FENTANYL CITRATE (PF) 100 MCG/2ML IJ SOLN: 50.0000 ug | INTRAMUSCULAR | Status: DC | PRN

## 2019-03-04 MED ORDER — OXYCODONE-ACETAMINOPHEN 5-325 MG PO TABS: 2.0000 | ORAL_TABLET | ORAL | Status: DC | PRN

## 2019-03-04 MED ORDER — LACTATED RINGERS IV SOLN
500.0000 mL | Freq: Once | INTRAVENOUS | Status: AC
  Administered 2019-03-04: 1000 mL via INTRAVENOUS

## 2019-03-04 MED ORDER — FENTANYL-BUPIVACAINE-NACL 0.5-0.125-0.9 MG/250ML-% EP SOLN: 12.0000 mL/h | EPIDURAL | Status: DC | PRN

## 2019-03-04 MED ORDER — ONDANSETRON HCL 4 MG/2ML IJ SOLN: 4.0000 mg | INTRAMUSCULAR | Status: DC | PRN

## 2019-03-04 MED ORDER — DIPHENHYDRAMINE HCL 25 MG PO CAPS: 25.0000 mg | ORAL_CAPSULE | Freq: Four times a day (QID) | ORAL | Status: DC | PRN

## 2019-03-04 MED ORDER — LACTATED RINGERS IV SOLN
INTRAVENOUS | Status: DC
  Administered 2019-03-04: 16:00:00 via INTRAVENOUS

## 2019-03-04 MED ORDER — LIDOCAINE HCL (PF) 1 % IJ SOLN
INTRAMUSCULAR | Status: DC | PRN
  Administered 2019-03-04 (×2): 4 mL via EPIDURAL

## 2019-03-04 MED ORDER — RHO D IMMUNE GLOBULIN 1500 UNIT/2ML IJ SOSY
300.0000 ug | PREFILLED_SYRINGE | Freq: Once | INTRAMUSCULAR | Status: DC
  Filled 2019-03-04: qty 2

## 2019-03-04 MED ORDER — LACTATED RINGERS IV SOLN: 500.0000 mL | INTRAVENOUS | Status: DC | PRN

## 2019-03-04 MED ORDER — SODIUM CHLORIDE (PF) 0.9 % IJ SOLN
INTRAMUSCULAR | Status: DC | PRN
  Administered 2019-03-04: 11.5 mL/h via EPIDURAL

## 2019-03-04 MED ORDER — PRENATAL MULTIVITAMIN CH
1.0000 | ORAL_TABLET | Freq: Every day | ORAL | Status: DC
  Administered 2019-03-05 – 2019-03-06 (×2): 1 via ORAL
  Filled 2019-03-04 (×2): qty 1

## 2019-03-04 MED ORDER — BENZOCAINE-MENTHOL 20-0.5 % EX AERO
1.0000 "application " | INHALATION_SPRAY | CUTANEOUS | Status: DC | PRN
  Administered 2019-03-05 – 2019-03-06 (×2): 1 via TOPICAL
  Filled 2019-03-04 (×2): qty 56

## 2019-03-04 MED ORDER — OXYTOCIN 40 UNITS IN NORMAL SALINE INFUSION - SIMPLE MED: 2.5000 [IU]/h | INTRAVENOUS | Status: DC

## 2019-03-04 MED ORDER — OXYCODONE-ACETAMINOPHEN 5-325 MG PO TABS: 1.0000 | ORAL_TABLET | ORAL | Status: DC | PRN

## 2019-03-04 MED ORDER — ONDANSETRON HCL 4 MG/2ML IJ SOLN: 4.0000 mg | Freq: Four times a day (QID) | INTRAMUSCULAR | Status: DC | PRN

## 2019-03-04 MED ORDER — DIBUCAINE (PERIANAL) 1 % EX OINT: 1.0000 "application " | TOPICAL_OINTMENT | CUTANEOUS | Status: DC | PRN

## 2019-03-04 MED ORDER — DIPHENHYDRAMINE HCL 50 MG/ML IJ SOLN: 12.5000 mg | INTRAMUSCULAR | Status: DC | PRN

## 2019-03-04 MED ORDER — ACETAMINOPHEN 325 MG PO TABS: 650.0000 mg | ORAL_TABLET | ORAL | Status: DC | PRN

## 2019-03-04 MED ORDER — FENTANYL CITRATE (PF) 100 MCG/2ML IJ SOLN: 100.0000 ug | INTRAMUSCULAR | Status: DC | PRN

## 2019-03-04 MED ORDER — SIMETHICONE 80 MG PO CHEW: 80.0000 mg | CHEWABLE_TABLET | ORAL | Status: DC | PRN

## 2019-03-04 MED ORDER — LACTATED RINGERS IV SOLN
INTRAVENOUS | Status: DC
  Administered 2019-03-04: 12:00:00 via INTRAVENOUS

## 2019-03-04 MED ORDER — OXYCODONE HCL 5 MG PO TABS: 10.0000 mg | ORAL_TABLET | ORAL | Status: DC | PRN

## 2019-03-04 MED ORDER — TERBUTALINE SULFATE 1 MG/ML IJ SOLN: 0.2500 mg | Freq: Once | INTRAMUSCULAR | Status: DC | PRN

## 2019-03-04 MED ORDER — SENNOSIDES-DOCUSATE SODIUM 8.6-50 MG PO TABS
2.0000 | ORAL_TABLET | ORAL | Status: DC
  Administered 2019-03-04 – 2019-03-05 (×2): 2 via ORAL
  Filled 2019-03-04 (×2): qty 2

## 2019-03-04 MED ORDER — OXYCODONE HCL 5 MG PO TABS: 5.0000 mg | ORAL_TABLET | ORAL | Status: DC | PRN

## 2019-03-04 MED ORDER — FENTANYL-BUPIVACAINE-NACL 0.5-0.125-0.9 MG/250ML-% EP SOLN
12.0000 mL/h | EPIDURAL | Status: DC | PRN
  Filled 2019-03-04: qty 250

## 2019-03-04 MED ORDER — OXYTOCIN BOLUS FROM INFUSION: 500.0000 mL | Freq: Once | INTRAVENOUS | Status: DC

## 2019-03-04 MED ORDER — OXYTOCIN 40 UNITS IN NORMAL SALINE INFUSION - SIMPLE MED
2.5000 [IU]/h | Freq: Once | INTRAVENOUS | Status: AC | PRN
  Administered 2019-03-04: 2.5 [IU]/h via INTRAVENOUS

## 2019-03-04 MED ORDER — PHENYLEPHRINE 40 MCG/ML (10ML) SYRINGE FOR IV PUSH (FOR BLOOD PRESSURE SUPPORT)
80.0000 ug | PREFILLED_SYRINGE | INTRAVENOUS | Status: DC | PRN
  Filled 2019-03-04: qty 10

## 2019-03-04 MED ORDER — ONDANSETRON HCL 4 MG PO TABS: 4.0000 mg | ORAL_TABLET | ORAL | Status: DC | PRN

## 2019-03-04 MED ORDER — TETANUS-DIPHTH-ACELL PERTUSSIS 5-2.5-18.5 LF-MCG/0.5 IM SUSP: 0.5000 mL | Freq: Once | INTRAMUSCULAR | Status: DC

## 2019-03-04 MED ORDER — IBUPROFEN 600 MG PO TABS
600.0000 mg | ORAL_TABLET | Freq: Four times a day (QID) | ORAL | Status: DC
  Administered 2019-03-04 – 2019-03-06 (×7): 600 mg via ORAL
  Filled 2019-03-04 (×7): qty 1

## 2019-03-04 MED ORDER — OXYTOCIN BOLUS FROM INFUSION
500.0000 mL | Freq: Once | INTRAVENOUS | Status: AC | PRN
  Administered 2019-03-04: 500 mL via INTRAVENOUS

## 2019-03-04 NOTE — H&P (Signed)
23 y.o. G3P1011 @ [redacted]w[redacted]d presents with painful contractions q7-10 minutes since 0430 and decreased fetal movement.  Otherwise has good fetal movement and no bleeding.  SVE in MAU is 6cm  Pregnancy c/b: 1. Threatened preterm labor at 103 weeks.  Was admitted to antepartum 11/3 to 11/6, received betamethasone.    2. History of depression: previously on zoloft after miscarriage.  Stopped during pregnancy  Past Medical History:  Diagnosis Date  . Depression   . Hx of varicella     Past Surgical History:  Procedure Laterality Date  . NO PAST SURGERIES      OB History  Gravida Para Term Preterm AB Living  3 1 1   1 1   SAB TAB Ectopic Multiple Live Births  1       1    # Outcome Date GA Lbr Len/2nd Weight Sex Delivery Anes PTL Lv  3 Current           2 SAB 03/18/18 [redacted]w[redacted]d         1 Term 12/04/13 [redacted]w[redacted]d 06:06 / 00:24 3022 g F Vag-Spont EPI  LIV    Social History   Socioeconomic History  . Marital status: Single    Spouse name: Not on file  . Number of children: Not on file  . Years of education: Not on file  . Highest education level: Not on file  Occupational History  . Not on file  Tobacco Use  . Smoking status: Never Smoker  . Smokeless tobacco: Never Used  Substance and Sexual Activity  . Alcohol use: No  . Drug use: Never  . Sexual activity: Yes  Other Topics Concern  . Not on file  Social History Narrative  . Not on file     Patient has no known allergies.    Prenatal Transfer Tool  Maternal Diabetes: No Genetic Screening: Normal Maternal Ultrasounds/Referrals: Normal Fetal Ultrasounds or other Referrals:  None Maternal Substance Abuse:  No Significant Maternal Medications:  None Significant Maternal Lab Results: Group B Strep negative and Rh negative  ABO, Rh: --/--/PENDING (12/12 1228) Antibody: PENDING (12/12 1228) Rubella:  Immune RPR:   Neg HBsAg:   Neg HIV:   Neg GBS: Negative/-- (11/02 0000)     Vitals:   03/04/19 1158 03/04/19 1302  BP: 115/73  113/77  Pulse: (!) 103 100  Resp: 20 16  Temp: 98.1 F (36.7 C)   SpO2:       General:  NAD Abdomen:  soft, gravid, EFW 6.5-7# Ex:  no edema SVE:  6/70/-2, AROM thin mec FHTs:  120s, moderate variability, + accels, category 1 Toco:  Not tracing well  Growth [place] 11/4:  5lb 2oz (47%)  A/P   23 y.o. Q8G5003 [redacted]w[redacted]d presents with advanced cervical dilation and irregular contractions Admit to L&D Will augment prn with pitocin Epidural upon request Rh neg FSR/ vtx/ GBS neg  [place]

## 2019-03-04 NOTE — Anesthesia Procedure Notes (Addendum)
Epidural Patient location during procedure: OB Start time: 03/04/2019 3:35 PM End time: 03/04/2019 3:42 PM  Staffing Anesthesiologist: Josephine Igo, MD Performed: anesthesiologist   Preanesthetic Checklist Completed: patient identified, IV checked, site marked, risks and benefits discussed, surgical consent, monitors and equipment checked, pre-op evaluation and timeout performed  Epidural Patient position: sitting Prep: DuraPrep and site prepped and draped Patient monitoring: continuous pulse ox and blood pressure Approach: midline Location: L3-L4 Injection technique: LOR air  Needle:  Needle type: Tuohy  Needle gauge: 17 G Needle length: 9 cm and 9 Needle insertion depth: 4 cm Catheter type: closed end flexible Catheter size: 19 Gauge Catheter at skin depth: 9 cm Test dose: negative and Other  Assessment Events: blood not aspirated, injection not painful, no injection resistance, no paresthesia and negative IV test  Additional Notes Patient identified. Risks and benefits discussed including failed block, incomplete  Pain control, post dural puncture headache, nerve damage, paralysis, blood pressure Changes, nausea, vomiting, reactions to medications-both toxic and allergic and post Partum back pain. All questions were answered. Patient expressed understanding and wished to proceed. Sterile technique was used throughout procedure. Epidural site was Dressed with sterile barrier dressing. No paresthesias, signs of intravascular injection Or signs of intrathecal spread were encountered.  Patient was more comfortable after the epidural was dosed. Please see RN's note for documentation of vital signs and FHR which are stable. Reason for block:surgical anesthesia

## 2019-03-04 NOTE — MAU Note (Signed)
Kim Weiss is a 24 y.o. at [redacted]w[redacted]d here in MAU reporting: contractions since 0430, they are every 7-10 minutes. No bleeding or LOF. Decreased FM, only felt a little bit this AM. States she was 5cm on Thursday. States Dr Carlis Abbott said to call her if the patient came to MAU for any reason this weekend.   Onset of complaint: today  Pain score: 6/10  Vitals:   03/04/19 1108  BP: 116/83  Pulse: (!) 106  Resp: 16  Temp: 98.1 F (36.7 C)  SpO2: 100%     FHT: 158  Lab orders placed from triage: none

## 2019-03-04 NOTE — Anesthesia Preprocedure Evaluation (Signed)
Anesthesia Evaluation  Patient identified by MRN, date of birth, ID band Patient awake    Reviewed: Allergy & Precautions, H&P , Patient's Chart, lab work & pertinent test results  Airway Mallampati: II  TM Distance: >3 FB Neck ROM: full    Dental no notable dental hx. (+) Teeth Intact   Pulmonary neg pulmonary ROS,    Pulmonary exam normal breath sounds clear to auscultation       Cardiovascular negative cardio ROS Normal cardiovascular exam Rhythm:regular Rate:Normal     Neuro/Psych negative neurological ROS  negative psych ROS   GI/Hepatic negative GI ROS, Neg liver ROS,   Endo/Other  Obesity  Renal/GU negative Renal ROS  negative genitourinary   Musculoskeletal   Abdominal   Peds  Hematology negative hematology ROS (+)   Anesthesia Other Findings   Reproductive/Obstetrics (+) Pregnancy                             Anesthesia Physical Anesthesia Plan  ASA: II  Anesthesia Plan: Epidural   Post-op Pain Management:    Induction:   PONV Risk Score and Plan:   Airway Management Planned:   Additional Equipment:   Intra-op Plan:   Post-operative Plan:   Informed Consent: I have reviewed the patients History and Physical, chart, labs and discussed the procedure including the risks, benefits and alternatives for the proposed anesthesia with the patient or authorized representative who has indicated his/her understanding and acceptance.       Plan Discussed with: Anesthesiologist  Anesthesia Plan Comments:         Anesthesia Quick Evaluation  

## 2019-03-05 LAB — CBC
HCT: 29.9 % — ABNORMAL LOW (ref 36.0–46.0)
Hemoglobin: 9.1 g/dL — ABNORMAL LOW (ref 12.0–15.0)
MCH: 23.9 pg — ABNORMAL LOW (ref 26.0–34.0)
MCHC: 30.4 g/dL (ref 30.0–36.0)
MCV: 78.5 fL — ABNORMAL LOW (ref 80.0–100.0)
Platelets: 255 10*3/uL (ref 150–400)
RBC: 3.81 MIL/uL — ABNORMAL LOW (ref 3.87–5.11)
RDW: 13.9 % (ref 11.5–15.5)
WBC: 12.2 10*3/uL — ABNORMAL HIGH (ref 4.0–10.5)
nRBC: 0 % (ref 0.0–0.2)

## 2019-03-05 LAB — RPR: RPR Ser Ql: NONREACTIVE

## 2019-03-05 MED ORDER — RHO D IMMUNE GLOBULIN 1500 UNIT/2ML IJ SOSY
300.0000 ug | PREFILLED_SYRINGE | Freq: Once | INTRAMUSCULAR | Status: AC
  Administered 2019-03-05: 300 ug via INTRAVENOUS
  Filled 2019-03-05: qty 2

## 2019-03-05 NOTE — Progress Notes (Signed)
[facility]  03/05/2019  Kim Weiss [birth year] [id number]     MOB was referred for history of depression. * Referral screened out by Clinical Social Worker because none of the following criteria appear to apply: ~ History of depression during this pregnancy, or of post-partum depression following prior miscarriage. * MOB's symptoms currently being treated with medication and/or therapy.  MOB will resume taking Zoloft.  MOB chose to stop taking medication during pregnancy. Please contact the Clinical Social Worker if needs arise, by [facility] request, or if MOB scores greater than 9/yes to question 10 on Edinburgh Postpartum Depression Screen.  CSW provided education regarding the baby blues period vs. perinatal mood disorders, discussed treatment and gave resources for mental health follow up if concerns arise.  CSW recommends self-evaluation during the postpartum time period using the New Mom Checklist from Postpartum Progress and encouraged MOB to contact a medical professional if symptoms are noted at any time.   CSW provided review of Sudden Infant Death Syndrome (SIDS) precautions.   CSW identifies no further need for intervention and no barriers to discharge at this time.  [name], BSW, MSW, [employer]  Licensed Holiday representative  Cell # [phone]  [email or web address]

## 2019-03-05 NOTE — Anesthesia Postprocedure Evaluation (Signed)
Anesthesia Post Note  Patient: Kim Weiss  Procedure(s) Performed: AN AD HOC LABOR EPIDURAL     Patient location during evaluation: Mother Baby Anesthesia Type: Epidural Level of consciousness: awake Pain management: satisfactory to patient Vital Signs Assessment: post-procedure vital signs reviewed and stable Respiratory status: spontaneous breathing Cardiovascular status: stable Anesthetic complications: no    Last Vitals:  Vitals:   03/05/19 0500 03/05/19 0855  BP: 101/63 109/81  Pulse: 75 71  Resp: 18 18  Temp: 36.7 C 36.9 C  SpO2: 100% 100%    Last Pain:  Vitals:   03/05/19 1156  TempSrc:   PainSc: 4    Pain Goal:                   [employer]

## 2019-03-05 NOTE — Lactation Note (Signed)
This note was copied from a baby's chart. Lactation Consultation Note  Patient Name: Kim Weiss Date: 03/05/2019  [name] now 70 hours old.  Infant in crib on arrival.  Mom reports he hasn't fed much since he was circumsized.  Offered to assist with feeding.  Mom agreed.  Showed mom how to do some hand expression and spoon feeding to rouse him.  Infant started cuing. Mom has compression stripe on right nipple.  Asked if they were painful.  She reported a little. Minimal assist with latch.  Mom breastfed in cradle hold on left and foottball hold on right.  Kept reminding her to make sure heads tilted back a little in football so he has enough room to swallow.  Mom with short shaft nipples urged mom to hand express and evert nipple prior to latching. Pacifier in crib. Urged parents not to offer pacifier at this time .  Discussed holding breast may help him to stay latched better. Urged to call lactation as needed.  Left mom and baby breastfeeding.   Maternal Data    Feeding Feeding Type: Breast Fed  LATCH Score                   Interventions    Lactation Tools Discussed/Used     Consult Status      Kim Weiss 03/05/2019, 9:55 PM

## 2019-03-05 NOTE — Lactation Note (Signed)
This note was copied from a baby's chart. Lactation Consultation Note  Patient Name: Kim Weiss [id number] Date: 03/05/2019 Reason for consult: Initial assessment;Term P2, 10 hour female infant. Per mom, she has Spectra 2 DEBP at home. Per mom, infant has latch 6  times since birth, most feedings have been 15-20 minutes. Per mom, she feels infant is breastfeeding well, she breastfed infant for 20 minutes prior to [facility] entering the room. LC did not observe a latch at this time. Infant had 2 voids and 3 stools since birth. Per mom, her 9 year old only breastfed for one day due to having GERD. Mom knows to breastfeed infant according to hunger cues, 8 to 12 times within 24 hours and on demand. Mom knows to call RN or LC if she has any questions, concerns or need assistance with latching infant to breast. Mom made aware of O/P services, breastfeeding support groups, community resources, and our phone # for post-discharge questions.     Maternal Data Formula Feeding for Exclusion: No Has patient been taught Hand Expression?: Yes Does the patient have breastfeeding experience prior to this delivery?: Yes  Feeding Feeding Type: Breast Fed  LATCH Score                   Interventions Interventions: Breast feeding basics reviewed;Skin to skin;Hand express  Lactation Tools Discussed/Used WIC Program: No   Consult Status Consult Status: Follow-up Date: 03/05/19 Follow-up type: In-patient    Vicente Serene 03/05/2019, 4:47 AM

## 2019-03-05 NOTE — Progress Notes (Signed)
Patient is doing well.  She is ambulating, voiding, tolerating PO.  Pain control is good.  Lochia is appropriate  Vitals:   03/04/19 2000 03/04/19 2105 03/05/19 0100 03/05/19 0500  BP: 116/75 110/87 107/76 101/63  Pulse: 85 95 82 75  Resp: 18 18 18 18   Temp: 98.4 F (36.9 C) 98.8 F (37.1 C) 98.6 F (37 C) 98 F (36.7 C)  TempSrc: Oral Oral Oral Oral  SpO2: 100% 100% 99% 100%  Weight:      Height:        NAD Fundus firm Ext: No edema  Lab Results  Component Value Date   WBC 12.2 (H) 03/05/2019   HGB 9.1 (L) 03/05/2019   HCT 29.9 (L) 03/05/2019   MCV 78.5 (L) 03/05/2019   PLT 255 03/05/2019    --/--/O NEG (12/12 1228)/RImmune  A/P 23 y.o. [id number] PPD#1. Routine care.   Rh negative---rhogam pending Desires circumcision. Discussed r/b/a of the procedure. Reviewed that circumcision is an elective surgical procedure and not considered medically necessary. Reviewed the risks of the procedure including the risk of infection, bleeding, damage to surrounding structures, including scrotum, shaft, urethra and head of penis, and an undesired cosmetic effect requiring additional procedures for revision. Consent signed.   Expect d/c tomorrow.    [place]

## 2019-03-06 ENCOUNTER — Encounter (HOSPITAL_COMMUNITY): Payer: Self-pay | Admitting: Obstetrics

## 2019-03-06 LAB — RH IG WORKUP (INCLUDES ABO/RH)
ABO/RH(D): O NEG
Fetal Screen: NEGATIVE
Gestational Age(Wks): 39
Unit division: 0

## 2019-03-06 MED ORDER — DOCUSATE SODIUM 100 MG PO CAPS: 100.0000 mg | ORAL_CAPSULE | Freq: Two times a day (BID) | ORAL | 0 refills | Status: AC

## 2019-03-06 MED ORDER — IBUPROFEN 600 MG PO TABS: 600.0000 mg | ORAL_TABLET | Freq: Four times a day (QID) | ORAL | 0 refills | Status: AC | PRN

## 2019-03-06 NOTE — Lactation Note (Signed)
This note was copied from a baby's chart. Lactation Consultation Note  Patient Name: Kim Weiss Date: 03/06/2019 Reason for consult: Follow-up assessment;1st time breastfeeding;Term  P2 mother whose infant is now 36 hours old.  Mother breast fed her first child (now 24 years old) for one day only.  Mother had a difficult day yesterday but feels like it is going better today.  She informed me that feeding on her right breast has been more difficult but he is getting better; claims he has fed well from the left breast.  Mother did give a supplement of donor milk last night.    Baby was beginning to show feeding cues and I offered to stay and observe her latch.  Mother agreeable.  She latched baby easily onto the left breast in the cradle hold.  Provided a couple of suggestions and mother receptive to learning.  Discussed breast feeding basics and encouraged her to be patient and continue practicing.  Reiterated that breast feeding takes time and patience to learn to feel comfortable and confident.  Mother verbalized understanding.  Informed her on how to make an OP [place] visit if she desires.  She has a DEBP for home use and has private insurance.  Engorgement prevention/treatment discussed.  Manual pump at bedside.  Parents had no further questions/concerns.  RN in room upon my arrival.   Maternal Data Formula Feeding for Exclusion: No Has patient been taught Hand Expression?: Yes Does the patient have breastfeeding experience prior to this delivery?: No(Mother breast fed for one day with first child)  Feeding Feeding Type: Breast Fed  LATCH Score Latch: Grasps breast easily, tongue down, lips flanged, rhythmical sucking.  Audible Swallowing: A few with stimulation  Type of Nipple: Everted at rest and after stimulation  Comfort (Breast/Nipple): Soft / non-tender  Hold (Positioning): Assistance needed to correctly position infant at breast and maintain latch.  LATCH Score:  8  Interventions Interventions: Breast feeding basics reviewed;Assisted with latch;Skin to skin;Support pillows  Lactation Tools Discussed/Used WIC Program: No   Consult Status Consult Status: Complete Date: 03/06/19 Follow-up type: Call as needed    Chailyn Racette R Byanka Landrus 03/06/2019, 9:08 AM

## 2019-03-06 NOTE — Discharge Summary (Signed)
Obstetric Discharge Summary Reason for Admission: onset of labor Prenatal Procedures: NST and ultrasound Intrapartum Procedures: spontaneous vaginal delivery Postpartum Procedures: none Complications-Operative and Postpartum: 2nd degree perineal laceration Hemoglobin  Date Value Ref Range Status  03/05/2019 9.1 (L) 12.0 - 15.0 g/dL Final   HCT  Date Value Ref Range Status  03/05/2019 29.9 (L) 36.0 - 46.0 % Final    Physical Exam:  General: alert, cooperative and appears stated age 23: appropriate Uterine Fundus: firm Incision: healing well DVT Evaluation: No evidence of DVT seen on physical exam.  Discharge Diagnoses: Term Pregnancy-delivered  Discharge Information: Date: 03/06/2019 Activity: pelvic rest Diet: routine Medications: Ibuprofen and Colace Condition: improved Instructions: refer to practice specific booklet Discharge to: home Follow-up Information    Jerelyn Charles, MD Follow up in 4 week(s).   Specialty: Obstetrics Why: For a postpartum evaluation Contact information: [address] [place] [address] [phone]           Newborn Data: Live born female  Birth Weight: 7 lb 4.9 oz (3314 g) APGAR: 8, 9  Newborn Delivery   Birth date/time: 03/04/2019 17:59:00 Delivery type: Vaginal, Spontaneous      Home with mother.  Vanessa Kick 03/06/2019, 10:47 AM

## 2019-03-07 LAB — BPAM RBC
Blood Product Expiration Date: 202012282359
Blood Product Expiration Date: 202012282359
Unit Type and Rh: 9500
Unit Type and Rh: 9500

## 2019-03-07 LAB — TYPE AND SCREEN
ABO/RH(D): O NEG
Antibody Screen: POSITIVE
Unit division: 0
Unit division: 0
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
